# Patient Record
Sex: Male | Born: 1990 | Race: White | Hispanic: No | Marital: Single | Smoking: Current every day smoker
Health system: Southern US, Community
[De-identification: ages and names within clinical notes are randomized; demographics above are authoritative.]

---
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
## 2008-08-30 ENCOUNTER — Emergency Department: Payer: Self-pay | Admitting: Internal Medicine

## 2009-12-17 ENCOUNTER — Emergency Department: Payer: Self-pay | Admitting: Emergency Medicine

## 2010-01-11 ENCOUNTER — Emergency Department: Payer: Self-pay | Admitting: Emergency Medicine

## 2010-09-21 ENCOUNTER — Emergency Department: Payer: Self-pay | Admitting: Emergency Medicine

## 2011-07-22 ENCOUNTER — Emergency Department: Payer: Self-pay | Admitting: Emergency Medicine

## 2011-08-11 ENCOUNTER — Emergency Department: Payer: Self-pay | Admitting: Emergency Medicine

## 2011-08-11 LAB — CBC
HCT: 44.6 % (ref 40.0–52.0)
MCH: 28.7 pg (ref 26.0–34.0)
MCHC: 33.2 g/dL (ref 32.0–36.0)
MCV: 86 fL (ref 80–100)
Platelet: 264 10*3/uL (ref 150–440)
RDW: 12.8 % (ref 11.5–14.5)
WBC: 7.7 10*3/uL (ref 3.8–10.6)

## 2011-08-11 LAB — BASIC METABOLIC PANEL
Anion Gap: 10 (ref 7–16)
BUN: 14 mg/dL (ref 7–18)
Calcium, Total: 9.4 mg/dL (ref 8.5–10.1)
Chloride: 109 mmol/L — ABNORMAL HIGH (ref 98–107)
Co2: 25 mmol/L (ref 21–32)
EGFR (African American): >60
EGFR (Non-African Amer.): >60
Potassium: 3.6 mmol/L (ref 3.5–5.1)
Sodium: 144 mmol/L (ref 136–145)

## 2012-04-27 ENCOUNTER — Emergency Department: Payer: Self-pay | Admitting: Emergency Medicine

## 2012-04-27 LAB — COMPREHENSIVE METABOLIC PANEL
Albumin: 4.3 g/dL (ref 3.4–5.0)
Alkaline Phosphatase: 105 U/L (ref 50–136)
Bilirubin,Total: 0.2 mg/dL (ref 0.2–1.0)
Co2: 20 mmol/L — ABNORMAL LOW (ref 21–32)
Creatinine: 1.02 mg/dL (ref 0.60–1.30)
EGFR (Non-African Amer.): >60
Glucose: 107 mg/dL — ABNORMAL HIGH (ref 65–99)
SGOT(AST): 32 U/L (ref 15–37)
SGPT (ALT): 49 U/L (ref 12–78)

## 2012-04-27 LAB — ACETAMINOPHEN LEVEL: Acetaminophen: <2 ug/mL

## 2012-04-27 LAB — CBC
HCT: 44.7 % (ref 40.0–52.0)
HGB: 15 g/dL (ref 13.0–18.0)
MCH: 28.2 pg (ref 26.0–34.0)
MCHC: 33.4 g/dL (ref 32.0–36.0)
Platelet: 336 10*3/uL (ref 150–440)
RBC: 5.29 10*6/uL (ref 4.40–5.90)
RDW: 13.5 % (ref 11.5–14.5)
WBC: 7.5 10*3/uL (ref 3.8–10.6)

## 2012-04-27 LAB — SALICYLATE LEVEL: Salicylates, Serum: 2.4 mg/dL

## 2012-04-27 LAB — TSH: Thyroid Stimulating Horm: 3.97 u[IU]/mL

## 2012-04-27 LAB — ETHANOL
Ethanol %: 0.155 % — ABNORMAL HIGH (ref 0.000–0.080)
Ethanol: 155 mg/dL

## 2012-11-01 ENCOUNTER — Emergency Department: Payer: Self-pay | Admitting: Emergency Medicine

## 2012-11-01 LAB — BASIC METABOLIC PANEL
Anion Gap: 3 — ABNORMAL LOW (ref 7–16)
Calcium, Total: 9.4 mg/dL (ref 8.5–10.1)
Chloride: 108 mmol/L — ABNORMAL HIGH (ref 98–107)
Co2: 27 mmol/L (ref 21–32)
Creatinine: 0.93 mg/dL (ref 0.60–1.30)
EGFR (African American): >60
EGFR (Non-African Amer.): >60
Glucose: 90 mg/dL (ref 65–99)
Osmolality: 274 (ref 275–301)
Potassium: 4.1 mmol/L (ref 3.5–5.1)

## 2012-11-01 LAB — CBC
MCH: 29.5 pg (ref 26.0–34.0)
MCHC: 34.3 g/dL (ref 32.0–36.0)
MCV: 86 fL (ref 80–100)
Platelet: 268 10*3/uL (ref 150–440)
RBC: 5.09 10*6/uL (ref 4.40–5.90)
RDW: 13 % (ref 11.5–14.5)

## 2012-11-01 LAB — LIPASE, BLOOD: Lipase: 94 U/L (ref 73–393)

## 2012-11-07 ENCOUNTER — Emergency Department: Payer: Self-pay | Admitting: Emergency Medicine

## 2013-02-04 ENCOUNTER — Emergency Department: Payer: Self-pay | Admitting: Emergency Medicine

## 2013-07-19 ENCOUNTER — Emergency Department: Payer: Self-pay | Admitting: Emergency Medicine

## 2014-03-19 ENCOUNTER — Emergency Department: Payer: Self-pay | Admitting: Emergency Medicine

## 2014-03-19 LAB — COMPREHENSIVE METABOLIC PANEL
ALBUMIN: 4 g/dL (ref 3.4–5.0)
AST: 26 U/L (ref 15–37)
Alkaline Phosphatase: 79 U/L
Anion Gap: 8 (ref 7–16)
BUN: 13 mg/dL (ref 7–18)
Bilirubin,Total: 0.7 mg/dL (ref 0.2–1.0)
CALCIUM: 9.2 mg/dL (ref 8.5–10.1)
CHLORIDE: 105 mmol/L (ref 98–107)
Co2: 26 mmol/L (ref 21–32)
Creatinine: 1.19 mg/dL (ref 0.60–1.30)
EGFR (African American): >60
EGFR (Non-African Amer.): >60
Glucose: 118 mg/dL — ABNORMAL HIGH (ref 65–99)
Osmolality: 279 (ref 275–301)
Potassium: 3.7 mmol/L (ref 3.5–5.1)
SGPT (ALT): 42 U/L
SODIUM: 139 mmol/L (ref 136–145)
Total Protein: 7.6 g/dL (ref 6.4–8.2)

## 2014-03-19 LAB — CBC WITH DIFFERENTIAL/PLATELET
BASOS ABS: 0.1 10*3/uL (ref 0.0–0.1)
Basophil %: 0.8 %
EOS ABS: 0.2 10*3/uL (ref 0.0–0.7)
Eosinophil %: 2 %
HCT: 45 % (ref 40.0–52.0)
HGB: 15 g/dL (ref 13.0–18.0)
LYMPHS ABS: 1.7 10*3/uL (ref 1.0–3.6)
Lymphocyte %: 19.9 %
MCH: 29.5 pg (ref 26.0–34.0)
MCHC: 33.3 g/dL (ref 32.0–36.0)
MCV: 89 fL (ref 80–100)
Monocyte #: 0.6 x10 3/mm (ref 0.2–1.0)
Monocyte %: 7.1 %
Neutrophil #: 5.9 10*3/uL (ref 1.4–6.5)
Neutrophil %: 70.2 %
PLATELETS: 252 10*3/uL (ref 150–440)
RBC: 5.08 10*6/uL (ref 4.40–5.90)
RDW: 12.9 % (ref 11.5–14.5)
WBC: 8.4 10*3/uL (ref 3.8–10.6)

## 2014-03-19 LAB — LIPASE, BLOOD: LIPASE: 108 U/L (ref 73–393)

## 2014-03-19 LAB — TROPONIN I: Troponin-I: <0.02 ng/mL

## 2014-11-28 ENCOUNTER — Emergency Department
Admission: EM | Admit: 2014-11-28 | Discharge: 2014-11-28 | Disposition: A | Discharge status: Home / Self Care | Payer: Self-pay | Attending: Emergency Medicine | Admitting: Emergency Medicine

## 2014-11-28 ENCOUNTER — Encounter: Payer: Self-pay | Admitting: Emergency Medicine

## 2014-11-28 DIAGNOSIS — S61112A Laceration without foreign body of left thumb with damage to nail, initial encounter: Secondary | ICD-10-CM | POA: Insufficient documentation

## 2014-11-28 DIAGNOSIS — Z720 Tobacco use: Secondary | ICD-10-CM | POA: Insufficient documentation

## 2014-11-28 DIAGNOSIS — Z23 Encounter for immunization: Secondary | ICD-10-CM | POA: Insufficient documentation

## 2014-11-28 DIAGNOSIS — Y288XXA Contact with other sharp object, undetermined intent, initial encounter: Secondary | ICD-10-CM | POA: Insufficient documentation

## 2014-11-28 DIAGNOSIS — Y9389 Activity, other specified: Secondary | ICD-10-CM | POA: Insufficient documentation

## 2014-11-28 DIAGNOSIS — Y990 Civilian activity done for income or pay: Secondary | ICD-10-CM | POA: Insufficient documentation

## 2014-11-28 DIAGNOSIS — Y9289 Other specified places as the place of occurrence of the external cause: Secondary | ICD-10-CM | POA: Insufficient documentation

## 2014-11-28 MED ORDER — TETANUS-DIPHTH-ACELL PERTUSSIS 5-2.5-18.5 LF-MCG/0.5 IM SUSP
0.5000 mL | Freq: Once | INTRAMUSCULAR | Status: AC
  Administered 2014-11-28: 0.5 mL via INTRAMUSCULAR
  Filled 2014-11-28: qty 0.5

## 2014-11-28 MED ORDER — CEPHALEXIN 500 MG PO CAPS: 500.0000 mg | ORAL_CAPSULE | Freq: Four times a day (QID) | ORAL | Status: AC

## 2014-11-28 NOTE — ED Notes (Signed)
Has an avulsion to left thumb  Bleeding controlled

## 2014-11-28 NOTE — ED Notes (Signed)
Patient reaffirmed that he does not want to file workman's compensation although injury occurred at work.  Patient states, "it was my fault."  Patient verbalized understanding of wound and nail care and taking entire course of antibiotics.

## 2014-11-28 NOTE — ED Notes (Signed)
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

## 2014-11-28 NOTE — ED Provider Notes (Signed)
CSN: [id number]     Arrival date & time 11/28/14  1135 History   First MD Initiated Contact with Patient 11/28/14 1218     Chief Complaint  Patient presents with  . Hand Injury    abrasion L thumb on cheese grater 20 minutes prior to arrival at work    HPI Comments: 24 year old right hand dominant male presents today complaining of left thumb laceration that occurred just prior to arrival at work. 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 There is minimal pain associated with the injury, bleeding is controlled. Tetanus was 5 years ago.   Patient is a 24 y.o. male presenting with hand injury. The history is provided by the patient.  Hand Injury Location:  Finger Time since incident:  30 minutes Injury: yes   Finger location:  L thumb Pain details:    Quality:  Aching   Severity:  Mild   Onset quality:  Sudden Handedness:  Right-handed Dislocation: no   Foreign body present:  No foreign bodies Tetanus status:  Out of date Prior injury to area:  No Relieved by:  Nothing Worsened by:  Nothing tried Ineffective treatments:  None tried Associated symptoms: no decreased range of motion, no stiffness and no swelling     History reviewed. No pertinent past medical history. History reviewed. No pertinent past surgical history. No family history on file. Social History  Substance Use Topics  . Smoking status: Current Every Day Smoker -- 1.00 packs/day    Types: Cigarettes  . Smokeless tobacco: None  . Alcohol Use: No    Review of Systems  Musculoskeletal: Negative for myalgias, arthralgias and stiffness.  Skin: Positive for wound.  All other systems reviewed and are negative.     Allergies  Tylenol  Home Medications   Prior to Admission medications   Medication Sig Start Date End Date Taking? Authorizing Greidys Deland  cephALEXin (KEFLEX) 500 MG capsule Take 1 capsule (500 mg total) by mouth 4 (four)  times daily. 11/28/14 12/08/14  Wilber Oliphant V, PA-C   BP 145/89 mmHg  Pulse 63  Temp(Src) 98 F (36.7 C) (Oral)  Resp 18  Ht 6\' 2"  (1.88 m)  Wt 275 lb (124.739 kg)  BMI 35.29 kg/m2  SpO2 100% Physical Exam  Constitutional: He is oriented to person, place, and time. He appears well-developed and well-nourished.  HENT:  Head: Normocephalic and atraumatic.  Musculoskeletal: Normal range of motion. He exhibits no tenderness.       Left hand: He exhibits laceration. He exhibits normal range of motion, no tenderness, no bony tenderness, normal two-point discrimination, normal capillary refill and no deformity. Normal sensation noted.       Hands: Neurological: He is alert and oriented to person, place, and time.  Skin: Skin is warm and dry. Laceration noted.  Psychiatric: He has a normal mood and affect. His behavior is normal. Judgment and thought content normal.  Nursing note and vitals reviewed.   ED Course  Procedures (including critical care time) Labs Review Labs Reviewed - No data to display  Imaging Review No results found. I have personally reviewed and evaluated these images and lab results as part of my medical decision-making.   EKG Interpretation None      MDM  Superficial lacerations, no indication for closure. Tetanus updated today. Course of keflex to cover for infection given nailbed exposure. Dressed with sterile gauze,  advised to keep area of nailbed covered. Counseled on return precautions for developing infection. Final diagnoses:  Laceration of thumb without foreign body with damage to nail, left, initial encounter      Luvenia Redden, PA-C 11/28/14 1332  Emily Filbert, MD 11/28/14 [phone]

## 2015-04-24 ENCOUNTER — Emergency Department
Admission: EM | Admit: 2015-04-24 | Discharge: 2015-04-24 | Disposition: A | Discharge status: Home / Self Care | Payer: Self-pay | Attending: Emergency Medicine | Admitting: Emergency Medicine

## 2015-04-24 ENCOUNTER — Encounter: Payer: Self-pay | Admitting: Emergency Medicine

## 2015-04-24 DIAGNOSIS — F17210 Nicotine dependence, cigarettes, uncomplicated: Secondary | ICD-10-CM | POA: Insufficient documentation

## 2015-04-24 DIAGNOSIS — H66002 Acute suppurative otitis media without spontaneous rupture of ear drum, left ear: Secondary | ICD-10-CM | POA: Insufficient documentation

## 2015-04-24 DIAGNOSIS — J069 Acute upper respiratory infection, unspecified: Secondary | ICD-10-CM | POA: Insufficient documentation

## 2015-04-24 MED ORDER — AMOXICILLIN 500 MG PO CAPS: 500.0000 mg | ORAL_CAPSULE | Freq: Three times a day (TID) | ORAL | Status: AC

## 2015-04-24 NOTE — ED Notes (Signed)
Bilateral earache x 1 1/2 week, productive cough in am.

## 2015-04-24 NOTE — ED Notes (Signed)
Pt reports nasal congestion. Denies fever.

## 2015-04-24 NOTE — ED Provider Notes (Signed)
[facility] Emergency Department Provider Note ____________________________________________  Time seen: Approximately 1:59 PM  I have reviewed the triage vital signs and the nursing notes.   HISTORY  Chief Complaint Otalgia   HPI Ryan Bradford is a 25 y.o. male is here complaining of bilateral ear pain for approximately one half weeks. Patient states that prior to the pain he had a upper respiratory infection and was taking some over-the-counter medication infrequently. He has not taken any medication last several days. This morning he developed a productive cough and his ear pain is much worse. He denies any fever but feels that he has had some chills. He also continues to smoke half pack cigarettes per day. Currently he rates his pain as 7/10.   History reviewed. No pertinent past medical history.  There are no active problems to display for this patient.   History reviewed. No pertinent past surgical history.  Current Outpatient Rx  Name  Route  Sig  Dispense  Refill  . amoxicillin (AMOXIL) 500 MG capsule   Oral   Take 1 capsule (500 mg total) by mouth 3 (three) times daily.   30 capsule   0     Allergies Tylenol  No family history on file.  Social History Social History  Substance Use Topics  . Smoking status: Current Every Day Smoker -- 0.50 packs/day    Types: Cigarettes  . Smokeless tobacco: None  . Alcohol Use: No    Review of Systems Constitutional: No fever/positive chills Eyes: No visual changes. ENT: No sore throat. Positive bilateral ear pain. Positive nasal congestion Cardiovascular: Denies chest pain.  Respiratory: Denies shortness of breath. Positive productive cough Gastrointestinal:   No nausea, no vomiting.  No diarrhea.  Musculoskeletal: Negative for back pain. Skin: Negative for rash. Neurological: Negative for headaches, focal weakness or numbness.  10-point ROS otherwise  negative.  ____________________________________________   PHYSICAL EXAM:  VITAL SIGNS: ED Triage Vitals  Enc Vitals Group     BP 04/24/15 1246 121/62 mmHg     Pulse Rate 04/24/15 1246 71     Resp 04/24/15 1246 18     Temp 04/24/15 1246 97.6 F (36.4 C)     Temp Source 04/24/15 1246 Oral     SpO2 04/24/15 1246 97 %     Weight 04/24/15 1246 230 lb (104.327 kg)     Height 04/24/15 1246  (1.88 m)     Head Cir --      Peak Flow --      Pain Score 04/24/15 1248 7     Pain Loc --      Pain Edu? --      Excl. in GC? --     Constitutional: Alert and oriented. Well appearing and in no acute distress. Eyes: Conjunctivae are normal. PERRL. EOMI. Head: Atraumatic. Nose: Moderate congestion/no rhinnorhea.  Left TM with erythema and poor light reflex. Right EAC is 75% occluded with cerumen therefore the TM is not completely visible. Mouth/Throat: Mucous membranes are moist.  Oropharynx non-erythematous. Moderate posterior drainage present. Neck: No stridor.   Hematological/Lymphatic/Immunilogical: No cervical lymphadenopathy. Cardiovascular: Normal rate, regular rhythm. Grossly normal heart sounds.  Good peripheral circulation. Respiratory: Normal respiratory effort.  No retractions. Lungs CTAB. Gastrointestinal: Soft and nontender. No distention.  Musculoskeletal: No lower extremity tenderness nor edema.  No joint effusions. Neurologic:  Normal speech and language. No gross focal neurologic deficits are appreciated. No gait instability. Skin:  Skin is warm, dry and intact. No  rash noted. Psychiatric: Mood and affect are normal. Speech and behavior are normal.  ____________________________________________   LABS (all labs ordered are listed, but only abnormal results are displayed)  Labs Reviewed - No data to display _ PROCEDURES  Procedure(s) performed: None  Critical Care performed: No  ____________________________________________   INITIAL IMPRESSION / ASSESSMENT  AND PLAN / ED COURSE  Pertinent labs & imaging results that were available during my care of the patient were reviewed by me and considered in my medical decision making (see chart for details).  Patient was started on amoxicillin 500 mg 3 times a day for 10 days. He is also to obtain some Sudafed over-the-counter as needed for nasal congestion. Tylenol or ibuprofen as needed for ear pain and fever if needed. He is follow-up with [facility] clinic if any continued problems. ____________________________________________   FINAL CLINICAL IMPRESSION(S) / ED DIAGNOSES  Final diagnoses:  Acute suppurative otitis media of left ear without spontaneous rupture of tympanic membrane, recurrence not specified  Acute upper respiratory infection      Tommi Rumps, PA-C 04/24/15 1408  Minna Antis, MD 04/24/15 [phone]

## 2016-05-15 ENCOUNTER — Emergency Department
Admission: EM | Admit: 2016-05-15 | Discharge: 2016-05-15 | Discharge status: Against Medical Advice | Payer: Self-pay | Attending: Emergency Medicine | Admitting: Emergency Medicine

## 2016-05-15 DIAGNOSIS — F17210 Nicotine dependence, cigarettes, uncomplicated: Secondary | ICD-10-CM | POA: Insufficient documentation

## 2016-05-15 DIAGNOSIS — M791 Myalgia: Secondary | ICD-10-CM | POA: Insufficient documentation

## 2016-05-15 DIAGNOSIS — R6889 Other general symptoms and signs: Secondary | ICD-10-CM

## 2016-05-15 DIAGNOSIS — J029 Acute pharyngitis, unspecified: Secondary | ICD-10-CM | POA: Insufficient documentation

## 2016-05-15 DIAGNOSIS — R42 Dizziness and giddiness: Secondary | ICD-10-CM | POA: Insufficient documentation

## 2016-05-15 DIAGNOSIS — R51 Headache: Secondary | ICD-10-CM | POA: Insufficient documentation

## 2016-05-15 DIAGNOSIS — R509 Fever, unspecified: Secondary | ICD-10-CM | POA: Insufficient documentation

## 2016-05-15 DIAGNOSIS — R0981 Nasal congestion: Secondary | ICD-10-CM | POA: Insufficient documentation

## 2016-05-15 DIAGNOSIS — R110 Nausea: Secondary | ICD-10-CM | POA: Insufficient documentation

## 2016-05-15 MED ORDER — IBUPROFEN 800 MG PO TABS
800.0000 mg | ORAL_TABLET | Freq: Once | ORAL | Status: AC
  Administered 2016-05-15: 800 mg via ORAL
  Filled 2016-05-15: qty 1

## 2016-05-15 NOTE — Discharge Instructions (Signed)
Please take ibuprofen 800 mg every 6 hours with food as needed for body aches and fevers. Taking over-the-counter cold and flu medicine for congestion symptoms. Return to the ER for any worsening symptoms urgent changes in her health.

## 2016-05-15 NOTE — ED Notes (Signed)
Pt states he has to leave to pick his daughter up from daycare and that he does not have time for further testing - provider notified and states that due to his concerns r/t dizziness that pt will need to sign AMA

## 2016-05-15 NOTE — ED Notes (Addendum)
See triage note  States he developed some body aches with headache and nasal congestion 2 days ago  Low grade fever at home

## 2016-05-15 NOTE — ED Triage Notes (Signed)
Pt c/o sinus/chest congestion with HA for the past couple of days.

## 2016-05-15 NOTE — ED Provider Notes (Signed)
[facility]-EMERGENCY DEPARTMENT Provider Note   CSN: [id number] Arrival date & time: 05/15/16  1555     History   Chief Complaint Chief Complaint  Patient presents with  . URI    HPI Ryan Bradford is a 26 y.o. male presents to the emergency department for evaluation of low-grade fever, headache, dizziness, sore throat, congestion. Symptoms began yesterday. He is unable take Tylenol due to allergy. Has taken ibuprofen this morning 8 AM saw improvement of all symptoms for one hour. He has not been to work today due to his symptoms. No known contacts with influenza. He isn't tolerating by mouth well with no vomiting or diarrhea. Has had mild nausea. No chest pain or shortness of breath. Patient's headache is mild, no neck pain. Denies any skin rash.  HPI  No past medical history on file.  There are no active problems to display for this patient.   No past surgical history on file.     Home Medications    Prior to Admission medications   Medication Sig Start Date End Date Taking? Authorizing Provider  amoxicillin (AMOXIL) 500 MG capsule Take 1 capsule (500 mg total) by mouth 3 (three) times daily. 04/24/15   Tommi Rumps, PA-C    Family History No family history on file.  Social History Social History  Substance Use Topics  . Smoking status: Current Every Day Smoker    Packs/day: 0.50    Types: Cigarettes  . Smokeless tobacco: Not on file  . Alcohol use No     Allergies   Tylenol [acetaminophen]   Review of Systems Review of Systems  Constitutional: Positive for fever. Negative for activity change, appetite change and chills.  HENT: Positive for congestion and sore throat. Negative for ear pain, mouth sores, rhinorrhea, sinus pressure and trouble swallowing.   Eyes: Negative for photophobia, pain and discharge.  Respiratory: Negative for cough, chest tightness, shortness of breath and wheezing.   Cardiovascular: Negative for chest pain and leg swelling.    Gastrointestinal: Positive for nausea. Negative for abdominal distention, abdominal pain, diarrhea and vomiting.  Genitourinary: Negative for difficulty urinating and dysuria.  Musculoskeletal: Positive for myalgias. Negative for arthralgias, back pain and gait problem.  Skin: Negative for color change and rash.  Neurological: Negative for dizziness and headaches.  Hematological: Negative for adenopathy.  Psychiatric/Behavioral: Negative for agitation and behavioral problems.     Physical Exam Updated Vital Signs BP 128/72 (BP Location: Left Arm)   Pulse 80   Temp 98.2 F (36.8 C) (Oral)   Resp 18   Ht 6\' 2"  (1.88 m)   Wt 129.3 kg   SpO2 98%   BMI 36.59 kg/m   Physical Exam  Constitutional: He is oriented to person, place, and time. He appears well-developed and well-nourished.  HENT:  Head: Normocephalic and atraumatic.  Right Ear: External ear normal.  Left Ear: External ear normal.  Nose: Nose normal.  Mouth/Throat: Oropharynx is clear and moist. No oropharyngeal exudate.  Eyes: Conjunctivae are normal. Right eye exhibits no discharge. Left eye exhibits no discharge.  Neck: Normal range of motion. Neck supple.  Cardiovascular: Normal rate and regular rhythm.   No murmur heard. Pulmonary/Chest: Effort normal and breath sounds normal. No respiratory distress. He has no wheezes. He has no rales. He exhibits no tenderness.  Abdominal: Soft. He exhibits no distension and no mass. There is no tenderness. There is no rebound and no guarding.  Musculoskeletal: Normal range of motion. He exhibits no edema.  Lymphadenopathy:    He has cervical adenopathy (posterior cervical).  Neurological: He is alert and oriented to person, place, and time. No cranial nerve deficit. Coordination normal.  Skin: Skin is warm and dry. No erythema.  Psychiatric: He has a normal mood and affect.  Nursing note and vitals reviewed.    ED Treatments / Results  Labs (all labs ordered are listed,  but only abnormal results are displayed) Labs Reviewed  RAPID INFLUENZA A&B ANTIGENS ([facility] ONLY)    EKG  EKG Interpretation None       Radiology No results found.  Procedures Procedures (including critical care time)  Medications Ordered in ED Medications  ibuprofen (ADVIL,MOTRIN) tablet 800 mg (800 mg Oral Given 05/15/16 1739)     Initial Impression / Assessment and Plan / ED Course  I have reviewed the triage vital signs and the nursing notes.  Pertinent labs & imaging results that were available during my care of the patient were reviewed by me and considered in my medical decision making (see chart for details).     26 year old male with subjective fever, body aches, congestion, headache. Symptoms improved with ibuprofen. Vitals are normal here in the emergency department. Suspicious for influenza. Influenza and strep test ordered, patient unwilling to wait for these tests, states he has to pick up his daughter. Patient educated on signs and symptoms to return to the ED for. Patient advised to continue with ibuprofen as needed for body aches. Take over-the-counter cold and flu medication. He will increase his fluid intake.  Final Clinical Impressions(s) / ED Diagnoses   Final diagnoses:  Flu-like symptoms    New Prescriptions New Prescriptions   No medications on file     Evon Slackhomas C Koryn Charlot, PA-C 05/15/16 1755    Jeanmarie PlantJames A McShane, MD 05/15/16 2256

## 2016-09-13 ENCOUNTER — Encounter: Payer: Self-pay | Admitting: *Deleted

## 2016-09-13 ENCOUNTER — Emergency Department
Admission: EM | Admit: 2016-09-13 | Discharge: 2016-09-13 | Disposition: A | Discharge status: Home / Self Care | Payer: Self-pay | Attending: Emergency Medicine | Admitting: Emergency Medicine

## 2016-09-13 DIAGNOSIS — R21 Rash and other nonspecific skin eruption: Secondary | ICD-10-CM | POA: Insufficient documentation

## 2016-09-13 DIAGNOSIS — F17210 Nicotine dependence, cigarettes, uncomplicated: Secondary | ICD-10-CM | POA: Insufficient documentation

## 2016-09-13 MED ORDER — DOXYCYCLINE HYCLATE 100 MG PO CAPS: 100.0000 mg | ORAL_CAPSULE | Freq: Two times a day (BID) | ORAL | 0 refills | Status: AC

## 2016-09-13 NOTE — ED Notes (Signed)
Denies recent changes to environment, hygiene or exposure. 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

## 2016-09-13 NOTE — ED Triage Notes (Signed)
Pt complains of a red rash on bilateral hands and legs, pt complains of itching, pt denies any other problems

## 2016-09-13 NOTE — ED Notes (Signed)

## 2016-09-13 NOTE — ED Provider Notes (Signed)
[facility] Emergency Department Provider Note  ____________________________________________  Time seen: Approximately 5:09 AM  I have reviewed the triage vital signs and the nursing notes.   HISTORY  Chief Complaint Rash    HPI Ryan Bradford is a 26 y.o. male complains of an itchy rash on bilateral hands and feet as well as also various other places including 1 his lower back and behind the right knee. He did have a tick bite about 2 weeks ago. Denies fever or body aches. No joint pain. Denies any known contact with toxic plants such as poison ivy or poison oak. No medications. No new environmental exposures     History reviewed. No pertinent past medical history.   There are no active problems to display for this patient.    History reviewed. No pertinent surgical history.   Prior to Admission medications   Medication Sig Start Date End Date Taking? Authorizing Provider  amoxicillin (AMOXIL) 500 MG capsule Take 1 capsule (500 mg total) by mouth 3 (three) times daily. 04/24/15   Tommi Rumps, PA-C  doxycycline (VIBRAMYCIN) 100 MG capsule Take 1 capsule (100 mg total) by mouth 2 (two) times daily. 09/13/16   Sharman Cheek, MD     Allergies Tylenol [acetaminophen]   No family history on file.  Social History Social History  Substance Use Topics  . Smoking status: Current Every Day Smoker    Packs/day: 0.50    Types: Cigarettes  . Smokeless tobacco: Not on file  . Alcohol use No    Review of Systems  Constitutional:   No fever or chills.  ENT:   No sore throat. No rhinorrhea. Cardiovascular:   No chest pain or syncope. Respiratory:   No dyspnea or cough. Gastrointestinal:   Negative for abdominal pain, vomiting and diarrhea.  Musculoskeletal:   Negative for focal pain or swelling All other systems reviewed and are negative except as documented above in ROS and  HPI.  ____________________________________________   PHYSICAL EXAM:  VITAL SIGNS: ED Triage Vitals  Enc Vitals Group     BP 09/13/16 0021 (!) 136/95     Pulse Rate 09/13/16 0021 88     Resp 09/13/16 0021 18     Temp 09/13/16 0021 98.6 F (37 C)     Temp Source 09/13/16 0021 Oral     SpO2 09/13/16 0021 100 %     Weight 09/13/16 0022 300 lb (136.1 kg)     Height 09/13/16 0022 6' (1.829 m)     Head Circumference --      Peak Flow --      Pain Score --      Pain Loc --      Pain Edu? --      Excl. in GC? --     Vital signs reviewed, nursing assessments reviewed.   Constitutional:   Alert and oriented. Well appearing and in no distress. Eyes:   No scleral icterus.  EOMI. No nystagmus. No conjunctival pallor. PERRL. ENT   Head:   Normocephalic and atraumatic.   Nose:   No congestion/rhinnorhea.    Mouth/Throat:   MMM, no pharyngeal erythema. No peritonsillar mass.    Neck:   No meningismus. Full ROM Hematological/Lymphatic/Immunilogical:   No cervical lymphadenopathy. Cardiovascular:   RRR. Symmetric bilateral radial and DP pulses.  No murmurs.  Respiratory:   Normal respiratory effort without tachypnea/retractions. Breath sounds are clear and equal bilaterally. No wheezes/rales/rhonchi. Gastrointestinal:   Soft and nontender. Non distended. There  is no CVA tenderness.  No rebound, rigidity, or guarding. Genitourinary:   deferred Musculoskeletal:   Normal range of motion in all extremities. No joint effusions.  No lower extremity tenderness.  No edema. Neurologic:   Normal speech and language.  Motor grossly intact. No gross focal neurologic deficits are appreciated.  Skin:    Skin is warm, dry With a rash on multiple body areas, most predominantly on the hands and feet. It appears to be a vesicular rash with crops of many small vesicles covering a total area of 5 or 6 cm where present. No purulence or induration. Not significantly  tender..  ____________________________________________    LABS (pertinent positives/negatives) (all labs ordered are listed, but only abnormal results are displayed) Labs Reviewed - No data to display ____________________________________________   EKG    ____________________________________________    RADIOLOGY  No results found.  ____________________________________________   PROCEDURES Procedures  ____________________________________________   INITIAL IMPRESSION / ASSESSMENT AND PLAN / ED COURSE  Pertinent labs & imaging results that were available during my care of the patient were reviewed by me and considered in my medical decision making (see chart for details).  Patient presents with rash on his body, recent tick exposure. Possibly contact dermatitis from poison ivy or oak, but it is possible this is [facility] fever without fever. I'll treat the patient with doxycycline. Otherwise conservative measures for rash including calamine or Benadryl.      ____________________________________________   FINAL CLINICAL IMPRESSION(S) / ED DIAGNOSES  Final diagnoses:  Rash and nonspecific skin eruption      New Prescriptions   DOXYCYCLINE (VIBRAMYCIN) 100 MG CAPSULE    Take 1 capsule (100 mg total) by mouth 2 (two) times daily.     Portions of this note were generated with dragon dictation software. Dictation errors may occur despite best attempts at proofreading.    Sharman CheekStafford, Judaea Burgoon, MD 09/13/16 [phone]

## 2019-03-15 ENCOUNTER — Other Ambulatory Visit: Payer: Self-pay

## 2019-03-15 DIAGNOSIS — Y906 Blood alcohol level of 120-199 mg/100 ml: Secondary | ICD-10-CM | POA: Insufficient documentation

## 2019-03-15 DIAGNOSIS — F10929 Alcohol use, unspecified with intoxication, unspecified: Secondary | ICD-10-CM | POA: Insufficient documentation

## 2019-03-15 DIAGNOSIS — F17210 Nicotine dependence, cigarettes, uncomplicated: Secondary | ICD-10-CM | POA: Insufficient documentation

## 2019-03-15 DIAGNOSIS — F1994 Other psychoactive substance use, unspecified with psychoactive substance-induced mood disorder: Secondary | ICD-10-CM | POA: Insufficient documentation

## 2019-03-15 NOTE — ED Triage Notes (Signed)
Patient reports he was very upset tonight.  [place] [employer] reports that patient states that he wanted to die.

## 2019-03-16 ENCOUNTER — Emergency Department
Admission: EM | Admit: 2019-03-16 | Discharge: 2019-03-16 | Disposition: A | Discharge status: Home / Self Care | Payer: Self-pay | Attending: Emergency Medicine | Admitting: Emergency Medicine

## 2019-03-16 DIAGNOSIS — F1994 Other psychoactive substance use, unspecified with psychoactive substance-induced mood disorder: Secondary | ICD-10-CM

## 2019-03-16 DIAGNOSIS — F10920 Alcohol use, unspecified with intoxication, uncomplicated: Secondary | ICD-10-CM

## 2019-03-16 DIAGNOSIS — F10929 Alcohol use, unspecified with intoxication, unspecified: Secondary | ICD-10-CM

## 2019-03-16 LAB — COMPREHENSIVE METABOLIC PANEL
ALT: 45 U/L — ABNORMAL HIGH (ref 0–44)
AST: 24 U/L (ref 15–41)
Albumin: 4.7 g/dL (ref 3.5–5.0)
Alkaline Phosphatase: 65 U/L (ref 38–126)
Anion gap: 13 (ref 5–15)
BUN: 12 mg/dL (ref 6–20)
CO2: 20 mmol/L — ABNORMAL LOW (ref 22–32)
Calcium: 9.7 mg/dL (ref 8.9–10.3)
Chloride: 109 mmol/L (ref 98–111)
Creatinine, Ser: 1.03 mg/dL (ref 0.61–1.24)
GFR calc Af Amer: >60 mL/min (ref >60)
GFR calc non Af Amer: >60 mL/min (ref >60)
Glucose, Bld: 100 mg/dL — ABNORMAL HIGH (ref 70–99)
Potassium: 3.4 mmol/L — ABNORMAL LOW (ref 3.5–5.1)
Sodium: 142 mmol/L (ref 135–145)
Total Bilirubin: 0.6 mg/dL (ref 0.3–1.2)
Total Protein: 8.2 g/dL — ABNORMAL HIGH (ref 6.5–8.1)

## 2019-03-16 LAB — ACETAMINOPHEN LEVEL: Acetaminophen (Tylenol), Serum: <10 ug/mL — ABNORMAL LOW (ref 10–30)

## 2019-03-16 LAB — ETHANOL: Alcohol, Ethyl (B): 189 mg/dL — ABNORMAL HIGH (ref <10)

## 2019-03-16 LAB — CBC
HCT: 44.6 % (ref 39.0–52.0)
Hemoglobin: 15 g/dL (ref 13.0–17.0)
MCH: 29.1 pg (ref 26.0–34.0)
MCHC: 33.6 g/dL (ref 30.0–36.0)
MCV: 86.6 fL (ref 80.0–100.0)
Platelets: 326 10*3/uL (ref 150–400)
RBC: 5.15 MIL/uL (ref 4.22–5.81)
RDW: 12.2 % (ref 11.5–15.5)
WBC: 11.3 10*3/uL — ABNORMAL HIGH (ref 4.0–10.5)
nRBC: 0 % (ref 0.0–0.2)

## 2019-03-16 LAB — SALICYLATE LEVEL: Salicylate Lvl: <7 mg/dL — ABNORMAL LOW (ref 7.0–30.0)

## 2019-03-16 NOTE — ED Notes (Signed)
NP with patient.
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
## 2019-03-16 NOTE — ED Notes (Signed)
Redirection attempted with minimal effect.

## 2019-03-16 NOTE — ED Notes (Signed)
EDP with patient.

## 2019-03-16 NOTE — ED Notes (Signed)
Hourly rounding reveals patient sitting on hall bed. Still moderately manic but redirectable, stable, in no acute distress. Q15 minute rounds and monitoring via Psychologist, counselling to continue.

## 2019-03-16 NOTE — Discharge Instructions (Signed)
You have been seen in the Emergency Department (ED) today for a psychiatric complaint.  You have been evaluated by psychiatry and the ED physician and we believe you are safe to be discharged from the hospital.    Please return to the ED immediately if you have ANY thoughts of hurting yourself or anyone else, so that we may help you.  Please stay with a sober adult tonight and try to work on controlling your emotions, particularly if you have been drinking.  Avoid alcohol and drug use.  Follow up with your doctor and/or therapist as soon as possible regarding today's ED visit.   Please follow up any other recommendations and clinic appointments provided by the psychiatry team that saw you in the Emergency Department.
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
## 2019-03-16 NOTE — ED Notes (Signed)
Pt. Transferred from Triage to 19 hall after dressing out and screening for contraband.  Pt. Oriented to Quad/Hall bed including Q15 minute rounds as well as Psychologist, counselling for their protection. Patient is alert and oriented, warm and dry in no acute distress. Patient denies SI, HI, and AVH. Pt. Encouraged to let me know if needs arise.

## 2019-04-01 ENCOUNTER — Emergency Department: Payer: Self-pay

## 2019-04-01 ENCOUNTER — Other Ambulatory Visit: Payer: Self-pay

## 2019-04-01 ENCOUNTER — Encounter: Payer: Self-pay | Admitting: Emergency Medicine

## 2019-04-01 ENCOUNTER — Emergency Department
Admission: EM | Admit: 2019-04-01 | Discharge: 2019-04-01 | Disposition: A | Discharge status: Home / Self Care | Payer: Self-pay | Attending: Emergency Medicine | Admitting: Emergency Medicine

## 2019-04-01 DIAGNOSIS — R0789 Other chest pain: Secondary | ICD-10-CM | POA: Insufficient documentation

## 2019-04-01 DIAGNOSIS — F17210 Nicotine dependence, cigarettes, uncomplicated: Secondary | ICD-10-CM | POA: Insufficient documentation

## 2019-04-01 DIAGNOSIS — F419 Anxiety disorder, unspecified: Secondary | ICD-10-CM | POA: Insufficient documentation

## 2019-04-01 LAB — TROPONIN I (HIGH SENSITIVITY)
Troponin I (High Sensitivity): <2 ng/L (ref <18)
Troponin I (High Sensitivity): <2 ng/L (ref <18)

## 2019-04-01 LAB — BASIC METABOLIC PANEL
Anion gap: 8 (ref 5–15)
BUN: 13 mg/dL (ref 6–20)
CO2: 27 mmol/L (ref 22–32)
Calcium: 9.7 mg/dL (ref 8.9–10.3)
Chloride: 107 mmol/L (ref 98–111)
Creatinine, Ser: 1.11 mg/dL (ref 0.61–1.24)
GFR calc Af Amer: >60 mL/min (ref >60)
GFR calc non Af Amer: >60 mL/min (ref >60)
Glucose, Bld: 98 mg/dL (ref 70–99)
Potassium: 4.2 mmol/L (ref 3.5–5.1)
Sodium: 142 mmol/L (ref 135–145)

## 2019-04-01 LAB — CBC
HCT: 45.9 % (ref 39.0–52.0)
Hemoglobin: 15.3 g/dL (ref 13.0–17.0)
MCH: 29.4 pg (ref 26.0–34.0)
MCHC: 33.3 g/dL (ref 30.0–36.0)
MCV: 88.1 fL (ref 80.0–100.0)
Platelets: 261 10*3/uL (ref 150–400)
RBC: 5.21 MIL/uL (ref 4.22–5.81)
RDW: 12.3 % (ref 11.5–15.5)
WBC: 10.8 10*3/uL — ABNORMAL HIGH (ref 4.0–10.5)
nRBC: 0 % (ref 0.0–0.2)

## 2019-04-01 MED ORDER — LORAZEPAM 0.5 MG PO TABS
0.5000 mg | ORAL_TABLET | Freq: Once | ORAL | Status: AC
  Administered 2019-04-01: 0.5 mg via ORAL
  Filled 2019-04-01: qty 1

## 2019-04-01 NOTE — ED Provider Notes (Signed)
[facility] Emergency Department Provider Note   ____________________________________________   First MD Initiated Contact with Patient 04/01/19 1647     (approximate)  I have reviewed the triage vital signs and the nursing notes.   HISTORY  Chief Complaint Chest Pain    HPI Ryan Bradford is a 29 y.o. male with past medical history of anxiety who presents to the ED complaining of chest pain.  Patient reports he has been having intermittent pain in his chest as well as shortness of breath for about the past week.  He describes the pain as sharp and in the center of his chest, seems to be much worse when he is getting anxious and tearful.  He states that he has been feeling very anxious for about the past month, ever since going through a break-up.  He denies any homicidal or suicidal ideation, has not had any auditory or visual hallucinations.  He denies any recent alcohol or drug abuse.  He does not currently see a psychiatrist or take any medications for anxiety.        History reviewed. No pertinent past medical history.  Patient Active Problem List   Diagnosis Date Noted  . Acute alcohol intoxication ([place]) 03/16/2019    History reviewed. No pertinent surgical history.  Prior to Admission medications   Medication Sig Start Date End Date Taking? Authorizing Provider  amoxicillin (AMOXIL) 500 MG capsule Take 1 capsule (500 mg total) by mouth 3 (three) times daily. Patient not taking: Reported on 03/16/2019 04/24/15   Johnn Hai, PA-C  doxycycline (VIBRAMYCIN) 100 MG capsule Take 1 capsule (100 mg total) by mouth 2 (two) times daily. Patient not taking: Reported on 03/16/2019 09/13/16   Carrie Mew, MD    Allergies Tylenol [acetaminophen]  No family history on file.  Social History Social History   Tobacco Use  . Smoking status: Current Every Day Smoker    Packs/day: 2.00    Types: Cigarettes  . Smokeless tobacco: Never Used    Substance Use Topics  . Alcohol use: No  . Drug use: Yes    Types: Marijuana    Review of Systems  Constitutional: No fever/chills Eyes: No visual changes. ENT: No sore throat. Cardiovascular: Positive for chest pain. Respiratory: Positive for shortness of breath. Gastrointestinal: No abdominal pain.  No nausea, no vomiting.  No diarrhea.  No constipation. Genitourinary: Negative for dysuria. Musculoskeletal: Negative for back pain. Skin: Negative for rash. Neurological: Negative for headaches, focal weakness or numbness.  ____________________________________________   PHYSICAL EXAM:  VITAL SIGNS: ED Triage Vitals  Enc Vitals Group     BP 04/01/19 1513 123/77     Pulse Rate 04/01/19 1513 82     Resp 04/01/19 1513 16     Temp 04/01/19 1513 98.3 F (36.8 C)     Temp Source 04/01/19 1513 Oral     SpO2 04/01/19 1513 99 %     Weight 04/01/19 1509 250 lb (113.4 kg)     Height 04/01/19 1509 6\' 2"  (1.88 m)     Head Circumference --      Peak Flow --      Pain Score 04/01/19 1509 2     Pain Loc --      Pain Edu? --      Excl. in [place]? --     Constitutional: Alert and oriented.  Anxious appearing and tearful. Eyes: Conjunctivae are normal. Head: Atraumatic. Nose: No congestion/rhinnorhea. Mouth/Throat: Mucous membranes are moist. Neck:  Normal ROM Cardiovascular: Normal rate, regular rhythm. Grossly normal heart sounds. Respiratory: Normal respiratory effort.  No retractions. Lungs CTAB. Gastrointestinal: Soft and nontender. No distention. Genitourinary: deferred Musculoskeletal: No lower extremity tenderness nor edema. Neurologic:  Normal speech and language. No gross focal neurologic deficits are appreciated. Skin:  Skin is warm, dry and intact. No rash noted. Psychiatric: Mood and affect are normal. Speech and behavior are normal.  ____________________________________________   LABS (all labs ordered are listed, but only abnormal results are displayed)  Labs  Reviewed  CBC - Abnormal; Notable for the following components:      Result Value   WBC 10.8 (*)    All other components within normal limits  BASIC METABOLIC PANEL  TROPONIN I (HIGH SENSITIVITY)  TROPONIN I (HIGH SENSITIVITY)   ____________________________________________  EKG  ED ECG REPORT I, Chesley Noon, the attending physician, personally viewed and interpreted this ECG.   Date: 04/02/2019  EKG Time: 15:06  Rate: 81  Rhythm: normal sinus rhythm  Axis: Normal  Intervals:none  ST&T Change: None   PROCEDURES  Procedure(s) performed (including Critical Care):  Procedures   ____________________________________________   INITIAL IMPRESSION / ASSESSMENT AND PLAN / ED COURSE       29 year old male with history of anxiety presents to the ED with frequent episodes of chest pain and shortness of breath occurring over about the past week.  EKG shows no evidence of arrhythmia or ischemia, labs and chest x-ray are unremarkable.  Low suspicion for ACS given his lack of risk factors, anxiety seems to be a more likely explanation.  I doubt either PE or dissection.  He was given of medication to help control his anxiety, but wished to leave the ED AGAINST MEDICAL ADVICE prior to completion of work-up and second set troponin.  He was advised to establish care with a PCP and to return to the ED for new or worsening symptoms.  Advised him also to establish care with a psychiatrist.      ____________________________________________   FINAL CLINICAL IMPRESSION(S) / ED DIAGNOSES  Final diagnoses:  Atypical chest pain  Anxiety     ED Discharge Orders    None       Note:  This document was prepared using Dragon voice recognition software and may include unintentional dictation errors.   Chesley Noon, MD 04/02/19 [phone]

## 2019-04-01 NOTE — ED Notes (Addendum)
This note is not being shared with the patient for the following reason: To prevent harm (release of this note would result in harm to the life or physical safety of the patient or another). Pt heavy sighing upon assessment about waiting. Pt made a comment about having to have blood redrawn and wait further in ER. Pt informed that staff want to make sure that his symptoms are not cardiac related. Informed that workup so far has been good but that protocol is to redraw blood after a few hours. Pt continues to heavy sigh. Pt informed about leaving AMA. Pt appears to have decided to stay and wait. Pt then began cursing at staff while having blood drawn.

## 2019-04-01 NOTE — ED Triage Notes (Signed)
Pt in via POV, reports generalized chest pain resulting in anxiety and syncopal episodes over the last week, denies any other accompanying symptoms.  Pt states, "I dont really have a support system right now."  Vitals WDL, NAD noted at this time.

## 2019-08-12 ENCOUNTER — Emergency Department: Admission: EM | Admit: 2019-08-12 | Discharge: 2019-08-12 | Discharge status: Against Medical Advice | Payer: Self-pay

## 2019-08-12 NOTE — ED Notes (Signed)
Called for triage no answer  

## 2019-08-12 NOTE — ED Triage Notes (Signed)
Called for triage no answer  

## 2019-08-12 NOTE — ED Notes (Signed)
Called no answer

## 2021-07-28 ENCOUNTER — Emergency Department: Payer: Self-pay

## 2021-07-28 ENCOUNTER — Other Ambulatory Visit: Payer: Self-pay

## 2021-07-28 ENCOUNTER — Emergency Department
Admission: EM | Admit: 2021-07-28 | Discharge: 2021-07-28 | Disposition: A | Discharge status: Home / Self Care | Payer: Self-pay | Attending: Emergency Medicine | Admitting: Emergency Medicine

## 2021-07-28 ENCOUNTER — Encounter: Payer: Self-pay | Admitting: Emergency Medicine

## 2021-07-28 DIAGNOSIS — S60221A Contusion of right hand, initial encounter: Secondary | ICD-10-CM | POA: Insufficient documentation

## 2021-07-28 DIAGNOSIS — W228XXA Striking against or struck by other objects, initial encounter: Secondary | ICD-10-CM | POA: Insufficient documentation

## 2021-07-28 DIAGNOSIS — M7989 Other specified soft tissue disorders: Secondary | ICD-10-CM | POA: Insufficient documentation

## 2021-07-28 NOTE — ED Provider Notes (Signed)
[facility] Provider Note    Event Date/Time   First MD Initiated Contact with Patient 07/28/21 1147     (approximate)   History   Hand Injury   HPI  Ryan Bradford is a 31 y.o. male who comes in with concerns for hand injury.  Patient reports that he actually punched a door with his right hand yesterday.  He reports that he has had some increasing swelling and difficulty to move his hand and given all the bruising he wanted to come to the ER to be evaluated.  He denies any other injuries  I reviewed a [facility] note from 06/30/2020 where patient was seen for COVID-19 testing  Physical Exam   Triage Vital Signs: ED Triage Vitals  Enc Vitals Group     BP 07/28/21 1117 137/88     Pulse Rate 07/28/21 1117 71     Resp 07/28/21 1117 18     Temp 07/28/21 1117 98.2 F (36.8 C)     Temp Source 07/28/21 1117 Oral     SpO2 07/28/21 1117 98 %     Weight 07/28/21 1102 300 lb (136.1 kg)     Height 07/28/21 1102 6\' 2"  (1.88 m)     Head Circumference --      Peak Flow --      Pain Score 07/28/21 1102 0     Pain Loc --      Pain Edu? --      Excl. in [place]? --     Most recent vital signs: Vitals:   07/28/21 1117  BP: 137/88  Pulse: 71  Resp: 18  Temp: 98.2 F (36.8 C)  SpO2: 98%     General: Awake, no distress.  CV:  Good peripheral perfusion.  Resp:  Normal effort.  Abd:  No distention.  Other:  Patient has swelling and bruising noted to the inner hand with some tenderness over the fifth and fourth metacarpal.  No snuffbox tenderness.  He is able to flex and extend the wrist although somewhat limited secondary to pain.  He is got sensation intact throughout.  He is median, ulnar, radial nerve are intact with finger movements but he does report pain with trying to move his fingers.  He has 2+ distal pulse.   ED Results / Procedures / Treatments   Labs (all labs ordered are listed, but only abnormal results are displayed) Labs Reviewed - No data to  display    RADIOLOGY I have reviewed the xray personally and interpreted and there is no evidence of any fracture of the wrist or the hand.   PROCEDURES:  Critical Care performed: No  Procedures   MEDICATIONS ORDERED IN ED: Medications - No data to display   IMPRESSION / MDM / [place] / ED COURSE  I reviewed the triage vital signs and the nursing notes.  My differential includes fracture, dislocation, bruising.  We will proceed with x-ray  X-rays negative.  Patient has no snuffbox tenderness suggest occult scaphoid fracture.  Patient placed in wrist splint and reports immediate feeling better.  We discussed following up with Ortho if symptoms or not getting better in a week and using Tylenol, ibuprofen for pain but I suspect most likely just a contusion and bruising at this time     FINAL CLINICAL IMPRESSION(S) / ED DIAGNOSES   Final diagnoses:  Contusion of right hand, initial encounter     Rx / DC Orders   ED Discharge Orders  None        Note:  This document was prepared using Dragon voice recognition software and may include unintentional dictation errors.   Vanessa [place], MD 07/28/21 1224
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
## 2021-07-28 NOTE — ED Triage Notes (Signed)
Pt via POV from home. Pt c/o R hand pain, punched the door, pt states that he cannot close his hand all the way. Denies any swelling. Pt is A&Ox4 and NAD

## 2021-07-28 NOTE — Discharge Instructions (Signed)
You can take Tylenol 1 g every 8 hours and ibuprofen 600 every 8 hours with food to help with pain.  You can use a splint for comfort but there is no fracture seen on x-ray today if you continue to have discomfort or issues with moving the hand you can call the orthopedic number to get follow-up.

## 2022-09-28 ENCOUNTER — Emergency Department: Payer: Self-pay

## 2022-09-28 ENCOUNTER — Encounter: Payer: Self-pay | Admitting: Emergency Medicine

## 2022-09-28 ENCOUNTER — Emergency Department
Admission: EM | Admit: 2022-09-28 | Discharge: 2022-09-28 | Disposition: A | Discharge status: Home / Self Care | Payer: Self-pay | Attending: Emergency Medicine | Admitting: Emergency Medicine

## 2022-09-28 ENCOUNTER — Other Ambulatory Visit: Payer: Self-pay

## 2022-09-28 DIAGNOSIS — R519 Headache, unspecified: Secondary | ICD-10-CM | POA: Insufficient documentation

## 2022-09-28 DIAGNOSIS — M79622 Pain in left upper arm: Secondary | ICD-10-CM | POA: Insufficient documentation
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
## 2022-09-28 NOTE — ED Notes (Signed)
Pt has been removed from ED by [facility]. Remains a pt. No answer. Delay in rooming pt, and pt care.
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
## 2022-09-28 NOTE — ED Notes (Signed)
Patient apearred to have abrasions around the right orbital and on the left anterior forarm with swelling and purplish skin color.

## 2023-03-20 ENCOUNTER — Encounter: Payer: Self-pay | Admitting: Emergency Medicine

## 2023-03-20 ENCOUNTER — Other Ambulatory Visit: Payer: Self-pay

## 2023-03-20 DIAGNOSIS — B9789 Other viral agents as the cause of diseases classified elsewhere: Secondary | ICD-10-CM | POA: Insufficient documentation

## 2023-03-20 DIAGNOSIS — J069 Acute upper respiratory infection, unspecified: Secondary | ICD-10-CM | POA: Insufficient documentation

## 2023-03-20 DIAGNOSIS — Z20822 Contact with and (suspected) exposure to covid-19: Secondary | ICD-10-CM | POA: Insufficient documentation

## 2023-03-20 LAB — RESP PANEL BY RT-PCR (RSV, FLU A&B, COVID)  RVPGX2
Influenza A by PCR: NEGATIVE
Influenza B by PCR: NEGATIVE
Resp Syncytial Virus by PCR: NEGATIVE
SARS Coronavirus 2 by RT PCR: NEGATIVE

## 2023-03-20 NOTE — ED Triage Notes (Signed)
 Pt reports cough, fatigue, and body aches that started yesterday. Denies SOB.

## 2023-03-21 ENCOUNTER — Emergency Department
Admission: EM | Admit: 2023-03-21 | Discharge: 2023-03-21 | Disposition: A | Discharge status: Home / Self Care | Payer: Self-pay | Attending: Emergency Medicine | Admitting: Emergency Medicine

## 2023-03-21 DIAGNOSIS — J069 Acute upper respiratory infection, unspecified: Secondary | ICD-10-CM

## 2023-03-21 NOTE — ED Notes (Signed)
AVS provided by edp was discussed with pt. Pt verbalized understanding with no additional questions at this time.

## 2023-03-21 NOTE — Discharge Instructions (Signed)
 Take acetaminophen  650 mg and ibuprofen  400 mg every 6 hours for aches/fever/pain.  Take with food.  Drink plenty of fluids to stay well-hydrated.    Thank you for choosing us  for your health care today!  Please see your primary doctor this week for a follow up appointment.   If you have any new, worsening, or unexpected symptoms call your doctor right away or come back to the emergency department for reevaluation.  It was my pleasure to care for you today.   Ginnie EDISON Cyrena, MD

## 2023-03-21 NOTE — ED Provider Notes (Signed)
 [facility] Provider Note    Event Date/Time   First MD Initiated Contact with Patient 03/21/23 0207     (approximate)   History   Cough   HPI  Ryan Bradford is a 33 y.o. male   Past medical history of significant past medical history, occasional vape and marijuana user who presents to Emergency Department with 2 days of flulike illness consisting of cough, fatigue, and bodyaches.  There is no chest pain or shortness of breath.  Denies fever.  No GI or GU symptoms.  No known sick contacts.       Physical Exam   Triage Vital Signs: ED Triage Vitals  Encounter Vitals Group     BP 03/20/23 2103 (!) 152/95     Systolic BP Percentile --      Diastolic BP Percentile --      Pulse Rate 03/20/23 2103 (!) 126     Resp 03/20/23 2103 18     Temp 03/20/23 2103 98.5 F (36.9 C)     Temp Source 03/20/23 2103 Oral     SpO2 03/20/23 2103 97 %     Weight 03/20/23 2105 300 lb (136.1 kg)     Height 03/20/23 2105 6' 2 (1.88 m)     Head Circumference --      Peak Flow --      Pain Score 03/20/23 2105 0     Pain Loc --      Pain Education --      Exclude from Growth Chart --     Most recent vital signs: Vitals:   03/20/23 2106 03/21/23 0304  BP:  137/81  Pulse: (!) 111 88  Resp:  18  Temp:  98.3 F (36.8 C)  SpO2:  98%    General: Awake, no distress.  CV:  Good peripheral perfusion.  Resp:  Normal effort.  Abd:  No distention.  Other:  Well-appearing normal vital signs afebrile.  No respiratory distress and clear lungs without focality or wheezing.  Soft nontender abdomen.  Appears euvolemic.   ED Results / Procedures / Treatments   Labs (all labs ordered are listed, but only abnormal results are displayed) Labs Reviewed  RESP PANEL BY RT-PCR (RSV, FLU A&B, COVID)  [id number]     I ordered and reviewed the above labs they are notable for neg for COVID RSV and flu    PROCEDURES:  Critical Care performed:  No  Procedures   MEDICATIONS ORDERED IN ED: Medications - No data to display  IMPRESSION / MDM / ASSESSMENT AND PLAN / ED COURSE  I reviewed the triage vital signs and the nursing notes.                                Patient's presentation is most consistent with acute presentation with potential threat to life or bodily function.  Differential diagnosis includes, but is not limited to, viral URI, bacterial pneumonia, considered but less likely sepsis or meningitis   MDM: Well-appearing nontoxic patient with viral URI symptoms for 2 days.  Viral testing is negative though early in course of illness may be false negative.  Would not alter the course of management in any case.  No wheezing so defer on nebulizer or inhaler treatment.  Looks nontoxic.  No focality on lung exam doubt bacterial pneumonia at this time.  Will closely monitor symptoms at home, follow-up with PMD, anticipatory guidance  given.  Discharge. \      FINAL CLINICAL IMPRESSION(S) / ED DIAGNOSES   Final diagnoses:  Viral URI with cough     Rx / DC Orders   ED Discharge Orders     None        Note:  This document was prepared using Dragon voice recognition software and may include unintentional dictation errors.    Cyrena Mylar, MD 03/21/23 [phone]

## 2024-01-22 IMAGING — DX DG HAND COMPLETE 3+V*R*
3 series · 3 of 3 positions shown · non-contrast
Comparison: None Available.

CLINICAL DATA: Right hand pain and swelling after punching door.

EXAM:
RIGHT HAND - COMPLETE 3+ VIEW

[hand ap]
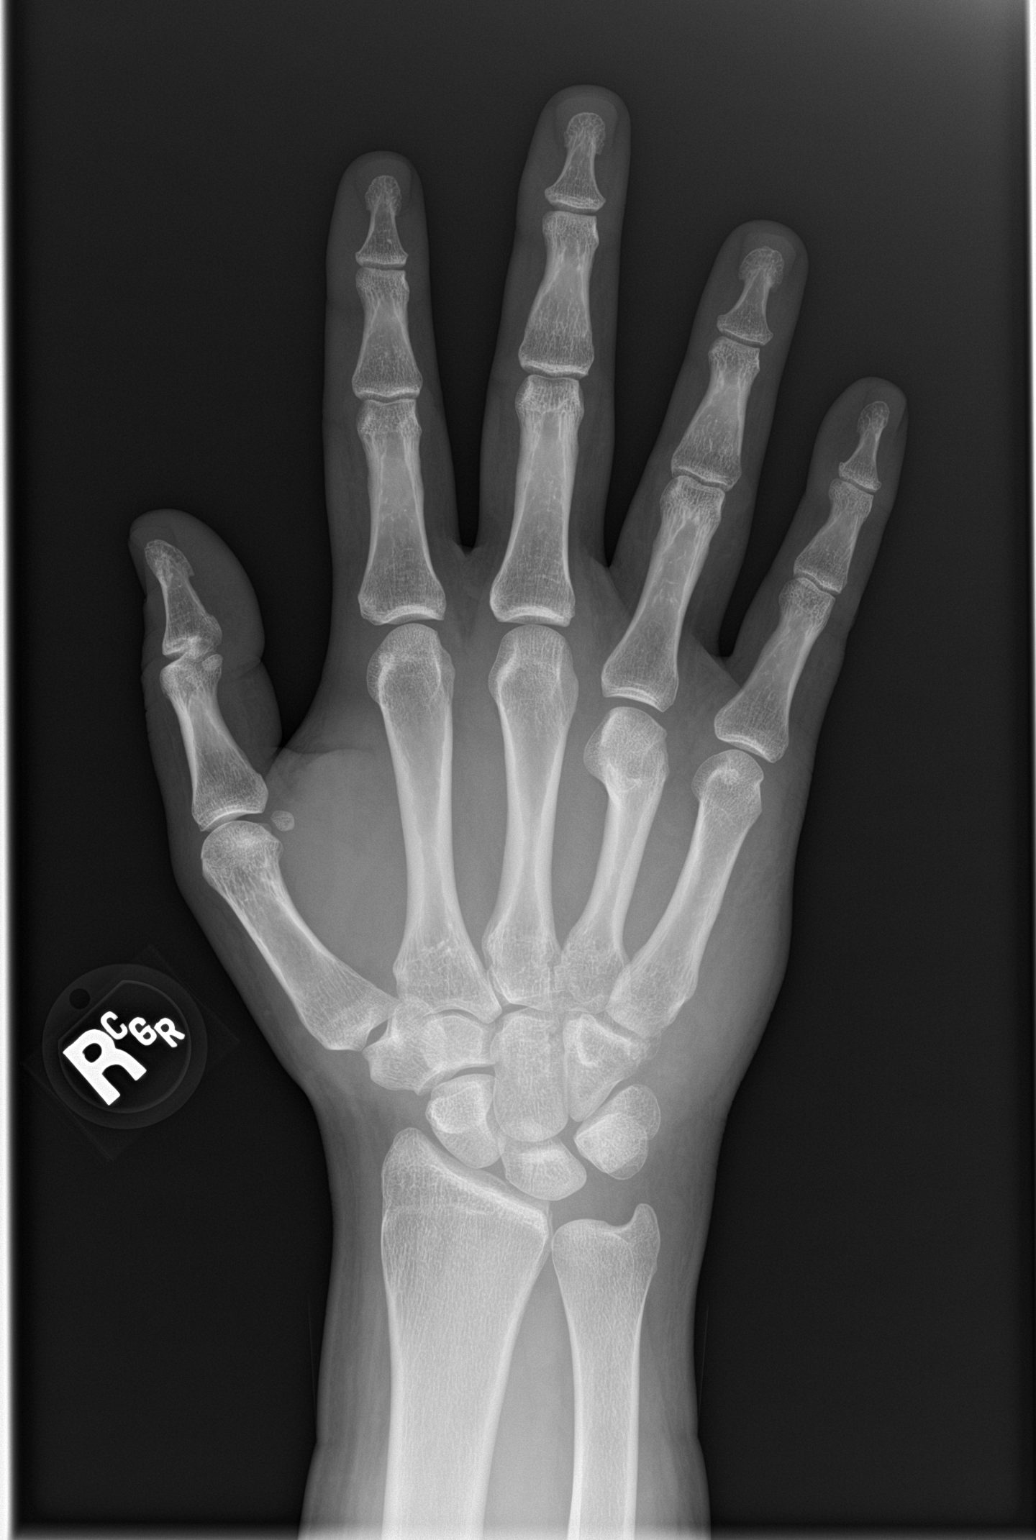

[hand obl]
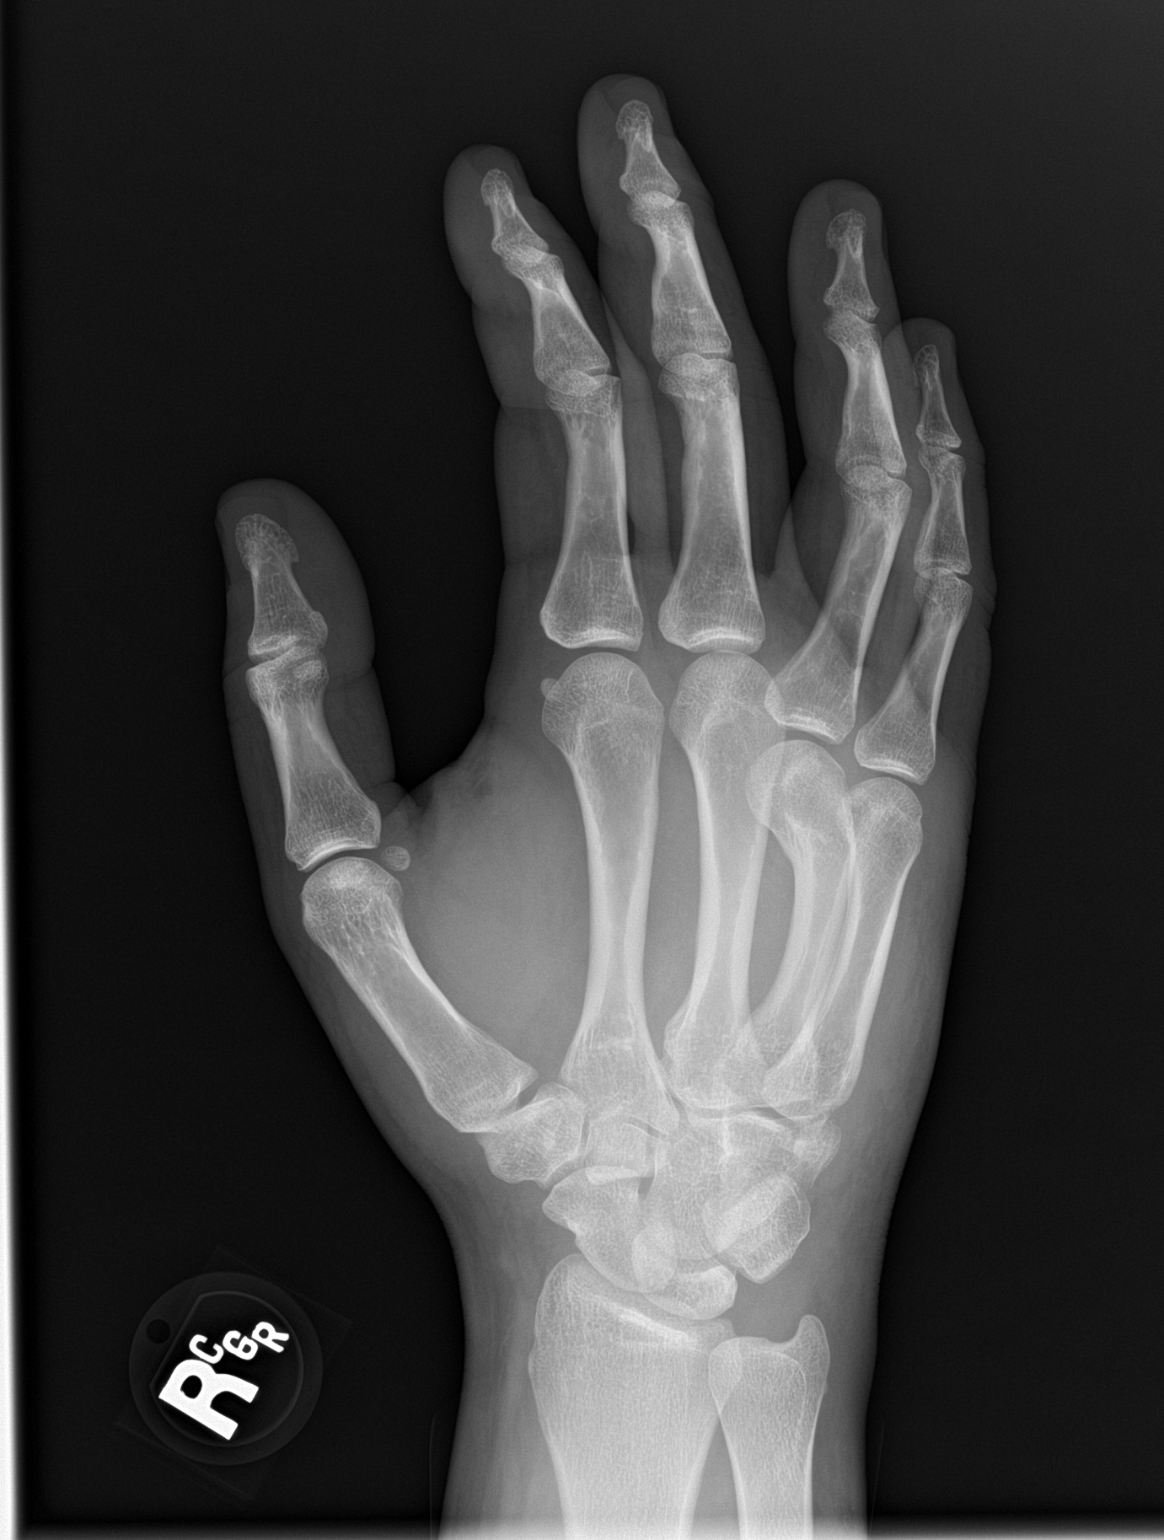

[hand lat]
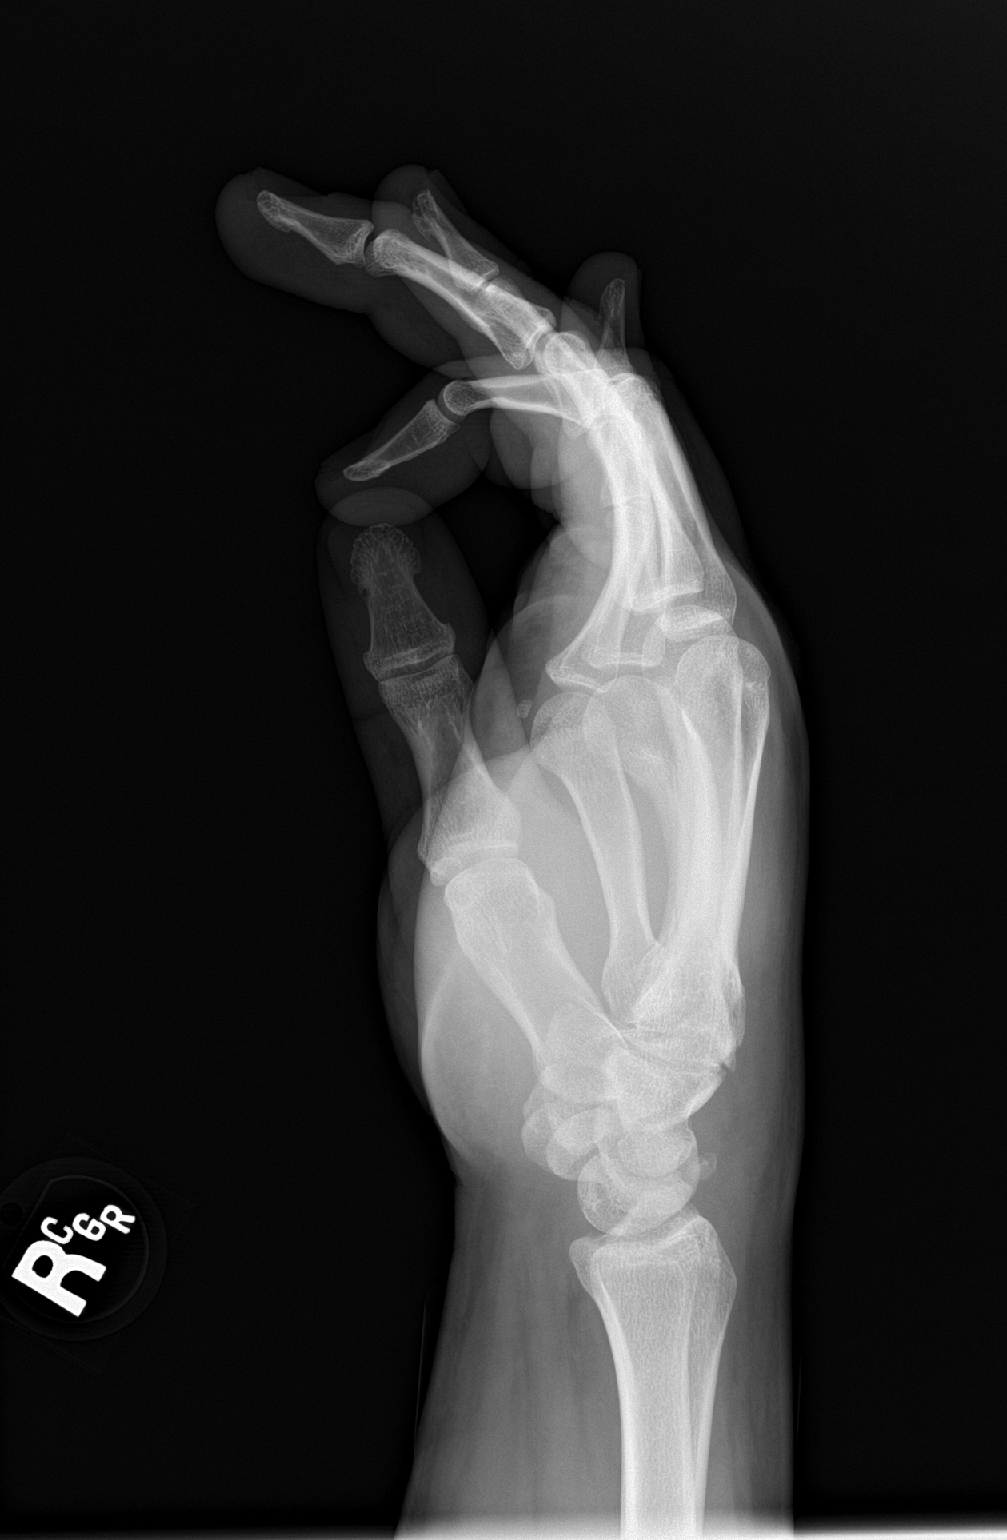

[3 of 3 positions shown; findings below may reference images not displayed]

FINDINGS: There is no evidence of acute fracture or dislocation. There is no
evidence of arthropathy. Deformity of distal fourth metacarpal is
noted consistent with old fracture. Soft tissues are unremarkable.
IMPRESSION: No acute abnormality seen.
